# Patient Record
Sex: Female | Born: 2000 | Hispanic: Yes | Marital: Single | Smoking: Never smoker
Health system: Southern US, Community
[De-identification: ages and names within clinical notes are randomized; demographics above are authoritative.]

## PROBLEM LIST (undated history)

## (undated) DIAGNOSIS — R87629 Unspecified abnormal cytological findings in specimens from vagina: Secondary | ICD-10-CM

## (undated) DIAGNOSIS — J189 Pneumonia, unspecified organism: Secondary | ICD-10-CM

## (undated) HISTORY — PX: CHOLECYSTECTOMY: SHX55

## (undated) HISTORY — DX: Unspecified abnormal cytological findings in specimens from vagina: R87.629

---
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
## 2021-03-31 ENCOUNTER — Emergency Department (HOSPITAL_COMMUNITY): Payer: Medicaid Other

## 2021-03-31 ENCOUNTER — Encounter (HOSPITAL_COMMUNITY): Payer: Self-pay

## 2021-03-31 ENCOUNTER — Emergency Department (HOSPITAL_COMMUNITY)
Admission: EM | Admit: 2021-03-31 | Discharge: 2021-04-01 | Disposition: A | Discharge status: Home / Self Care | Payer: Medicaid Other | Attending: Emergency Medicine | Admitting: Emergency Medicine

## 2021-03-31 ENCOUNTER — Other Ambulatory Visit: Payer: Self-pay

## 2021-03-31 DIAGNOSIS — S301XXA Contusion of abdominal wall, initial encounter: Secondary | ICD-10-CM

## 2021-03-31 DIAGNOSIS — S298XXA Other specified injuries of thorax, initial encounter: Secondary | ICD-10-CM

## 2021-03-31 DIAGNOSIS — Z79899 Other long term (current) drug therapy: Secondary | ICD-10-CM | POA: Insufficient documentation

## 2021-03-31 DIAGNOSIS — J45909 Unspecified asthma, uncomplicated: Secondary | ICD-10-CM | POA: Diagnosis not present

## 2021-03-31 DIAGNOSIS — S3991XA Unspecified injury of abdomen, initial encounter: Secondary | ICD-10-CM | POA: Diagnosis present

## 2021-03-31 DIAGNOSIS — Y92410 Unspecified street and highway as the place of occurrence of the external cause: Secondary | ICD-10-CM | POA: Diagnosis not present

## 2021-03-31 LAB — I-STAT BETA HCG BLOOD, ED (MC, WL, AP ONLY): I-stat hCG, quantitative: <5 m[IU]/mL (ref <5)
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
## 2021-04-01 NOTE — ED Notes (Signed)
Discharge instructions reviewed with patient . Patient verbalized understanding of instructions. Follow-up care was reviewed. Patient ambulatory with steady gait. VSS upon discharge.  

## 2021-10-13 ENCOUNTER — Encounter (HOSPITAL_BASED_OUTPATIENT_CLINIC_OR_DEPARTMENT_OTHER): Payer: Self-pay

## 2021-10-13 DIAGNOSIS — J351 Hypertrophy of tonsils: Secondary | ICD-10-CM | POA: Diagnosis not present

## 2021-10-13 DIAGNOSIS — Z20822 Contact with and (suspected) exposure to covid-19: Secondary | ICD-10-CM | POA: Insufficient documentation

## 2021-10-13 DIAGNOSIS — J029 Acute pharyngitis, unspecified: Secondary | ICD-10-CM | POA: Diagnosis present

## 2021-10-13 NOTE — ED Triage Notes (Signed)
Sore throat x 1 week  Worse today

## 2021-10-14 ENCOUNTER — Emergency Department (HOSPITAL_BASED_OUTPATIENT_CLINIC_OR_DEPARTMENT_OTHER)
Admission: EM | Admit: 2021-10-14 | Discharge: 2021-10-14 | Disposition: A | Discharge status: Home / Self Care | Payer: Medicaid Other | Attending: Emergency Medicine | Admitting: Emergency Medicine

## 2021-10-14 DIAGNOSIS — J029 Acute pharyngitis, unspecified: Secondary | ICD-10-CM

## 2021-10-14 DIAGNOSIS — J351 Hypertrophy of tonsils: Secondary | ICD-10-CM

## 2021-10-14 LAB — GROUP A STREP BY PCR: Group A Strep by PCR: NOT DETECTED

## 2021-10-14 LAB — SARS CORONAVIRUS 2 BY RT PCR: SARS Coronavirus 2 by RT PCR: NEGATIVE

## 2021-10-14 MED ORDER — LIDOCAINE VISCOUS HCL 2 % MT SOLN
15.0000 mL | Freq: Once | OROMUCOSAL | Status: AC
  Administered 2021-10-14: 15 mL via OROMUCOSAL
  Filled 2021-10-14: qty 15

## 2021-10-14 MED ORDER — DEXAMETHASONE SODIUM PHOSPHATE 10 MG/ML IJ SOLN
10.0000 mg | Freq: Once | INTRAMUSCULAR | Status: AC
  Administered 2021-10-14: 10 mg via INTRAMUSCULAR
  Filled 2021-10-14: qty 1

## 2021-10-14 NOTE — ED Provider Notes (Signed)
[facility] EMERGENCY DEPT Provider Note   CSN: [id number] Arrival date & time: 10/13/21  2331     History  Chief Complaint  Patient presents with   Sore Throat    Kaylee Alvarez is a 21 y.o. female.  The history is provided by the patient.  Sore Throat This is a new problem. The current episode started more than 1 week ago. The problem occurs constantly. The problem has not changed since onset.Pertinent negatives include no chest pain, no abdominal pain, no headaches and no shortness of breath. Nothing aggravates the symptoms. Nothing relieves the symptoms. She has tried nothing for the symptoms. The treatment provided no relief.       Home Medications Prior to Admission medications   Medication Sig Start Date End Date Taking? Authorizing Provider  cetirizine (ZYRTEC) 10 MG tablet Take 10 mg by mouth at bedtime as needed for allergies. 06/16/20   [provider]  MILI 0.25-35 MG-MCG tablet Take 1 tablet by mouth daily. 06/16/20   [provider]  ondansetron (ZOFRAN ODT) 4 MG disintegrating tablet Take 1 tablet (4 mg total) by mouth every 8 (eight) hours as needed for nausea or vomiting. 07/01/20   Petrucelli, Samantha R, PA-C      Allergies    Ibuprofen    Review of Systems   Review of Systems  Constitutional:  Negative for fever.  HENT:  Positive for sore throat. Negative for facial swelling, trouble swallowing and voice change.   Eyes:  Negative for redness.  Respiratory:  Negative for shortness of breath.   Cardiovascular:  Negative for chest pain.  Gastrointestinal:  Negative for abdominal pain.  Neurological:  Negative for headaches.  All other systems reviewed and are negative.   Physical Exam Updated Vital Signs BP 108/71   Pulse 86   Temp 98.3 F (36.8 C) (Oral)   Resp 18   Ht 5\' 2"  (1.575 m)   Wt 74.8 kg   LMP 09/09/2021 (Exact Date)   SpO2 100%   BMI 30.18 kg/m  Physical Exam Vitals and nursing note  reviewed.  Constitutional:      General: She is not in acute distress.    Appearance: She is well-developed.  HENT:     Head: Normocephalic and atraumatic.     Nose: Nose normal.     Mouth/Throat:     Mouth: Mucous membranes are moist.     Pharynx: No oropharyngeal exudate or posterior oropharyngeal erythema.     Comments: Large symmetric tonsils, intact phonation  Eyes:     Pupils: Pupils are equal, round, and reactive to light.  Cardiovascular:     Rate and Rhythm: Normal rate and regular rhythm.     Pulses: Normal pulses.     Heart sounds: Normal heart sounds.  Pulmonary:     Effort: Pulmonary effort is normal. No respiratory distress.     Breath sounds: Normal breath sounds.  Abdominal:     General: Abdomen is flat. Bowel sounds are normal. There is no distension.     Palpations: Abdomen is soft.     Tenderness: There is no abdominal tenderness. There is no guarding or rebound.  Genitourinary:    Vagina: No vaginal discharge.  Musculoskeletal:        General: Normal range of motion.     Cervical back: Normal range of motion and neck supple.  Lymphadenopathy:     Cervical: No cervical adenopathy.  Skin:    General: Skin is warm and dry.  Capillary Refill: Capillary refill takes less than 2 seconds.     Findings: No erythema or rash.  Neurological:     General: No focal deficit present.     Mental Status: She is oriented to person, place, and time.     Deep Tendon Reflexes: Reflexes normal.  Psychiatric:        Mood and Affect: Mood normal.        Behavior: Behavior normal.     ED Results / Procedures / Treatments   Labs (all labs ordered are listed, but only abnormal results are displayed) Labs Reviewed  GROUP A STREP BY PCR  SARS CORONAVIRUS 2 BY RT PCR    EKG None  Radiology No results found.  Procedures Procedures    Medications Ordered in ED Medications  lidocaine (XYLOCAINE) 2 % viscous mouth solution 15 mL (15 mLs Mouth/Throat Given 10/14/21  0144)  dexamethasone (DECADRON) injection 10 mg (10 mg Intramuscular Given 10/14/21 0155)    ED Course/ Medical Decision Making/ A&P                           Medical Decision Making Patient with a weeks worth of sore throat.    Problems Addressed: Tonsillar enlargement:    Details: follow up with ENT Viral pharyngitis:    Details: tylenol and ice cold liquids  Amount and/or Complexity of Data Reviewed Independent Historian: parent    Details: see above Labs: ordered.    Details: negative covid and strep.  Risk Prescription drug management. Risk Details: Symptoms appear to be viral, patient does have tonsillar hypertrophy. Tylenol and pain relievers patient is not allergic to, cold liquids.  Follow up with ENT for ongoing care.  Strict return precautions givewn     Final Clinical Impression(s) / ED Diagnoses Final diagnoses:  Viral pharyngitis  Tonsillar enlargement   Return for intractable cough, coughing up blood, fevers > 100.4 unrelieved by medication, shortness of breath, intractable vomiting, chest pain, shortness of breath, weakness, numbness, changes in speech, facial asymmetry, abdominal pain, passing out, Inability to tolerate liquids or food, cough, altered mental status or any concerns. No signs of systemic illness or infection. The patient is nontoxic-appearing on exam and vital signs are within normal limits.  I have reviewed the triage vital signs and the nursing notes. Pertinent labs & imaging results that were available during my care of the patient were reviewed by me and considered in my medical decision making (see chart for details). After history, exam, and medical workup I feel the patient has been appropriately medically screened and is safe for discharge home. Pertinent diagnoses were discussed with the patient. Patient was given return precautions.        Rx / DC Orders ED Discharge Orders     None         Connelly [place], MD 10/14/21 6314

## 2021-10-14 NOTE — ED Notes (Signed)
Pt verbalizes understanding of discharge instructions. Opportunity for questioning and answers were provided. Pt discharged from ED to home with family.    

## 2021-10-17 ENCOUNTER — Encounter (HOSPITAL_BASED_OUTPATIENT_CLINIC_OR_DEPARTMENT_OTHER): Payer: Self-pay

## 2021-10-17 ENCOUNTER — Emergency Department (HOSPITAL_BASED_OUTPATIENT_CLINIC_OR_DEPARTMENT_OTHER): Payer: Medicaid Other

## 2021-10-17 ENCOUNTER — Emergency Department (HOSPITAL_BASED_OUTPATIENT_CLINIC_OR_DEPARTMENT_OTHER)
Admission: EM | Admit: 2021-10-17 | Discharge: 2021-10-18 | Disposition: A | Discharge status: Home / Self Care | Payer: Medicaid Other | Attending: Emergency Medicine | Admitting: Emergency Medicine

## 2021-10-17 ENCOUNTER — Other Ambulatory Visit: Payer: Self-pay

## 2021-10-17 DIAGNOSIS — J029 Acute pharyngitis, unspecified: Secondary | ICD-10-CM | POA: Diagnosis present

## 2021-10-17 DIAGNOSIS — D72829 Elevated white blood cell count, unspecified: Secondary | ICD-10-CM | POA: Insufficient documentation

## 2021-10-17 DIAGNOSIS — J36 Peritonsillar abscess: Secondary | ICD-10-CM | POA: Diagnosis not present

## 2021-10-17 DIAGNOSIS — R000 Tachycardia, unspecified: Secondary | ICD-10-CM | POA: Insufficient documentation

## 2021-10-17 LAB — CBC WITH DIFFERENTIAL/PLATELET
Abs Immature Granulocytes: 0.07 10*3/uL (ref 0.00–0.07)
Basophils Absolute: 0.1 10*3/uL (ref 0.0–0.1)
Basophils Relative: 0 %
Eosinophils Absolute: 0.1 10*3/uL (ref 0.0–0.5)
Eosinophils Relative: 0 %
HCT: 39 % (ref 36.0–46.0)
Hemoglobin: 12.7 g/dL (ref 12.0–15.0)
Immature Granulocytes: 0 %
Lymphocytes Relative: 19 %
Lymphs Abs: 3.1 10*3/uL (ref 0.7–4.0)
MCH: 28 pg (ref 26.0–34.0)
MCHC: 32.6 g/dL (ref 30.0–36.0)
MCV: 85.9 fL (ref 80.0–100.0)
Monocytes Absolute: 1.1 10*3/uL — ABNORMAL HIGH (ref 0.1–1.0)
Monocytes Relative: 7 %
Neutro Abs: 12 10*3/uL — ABNORMAL HIGH (ref 1.7–7.7)
Neutrophils Relative %: 74 %
Platelets: 264 10*3/uL (ref 150–400)
RBC: 4.54 MIL/uL (ref 3.87–5.11)
RDW: 13 % (ref 11.5–15.5)
WBC: 16.4 10*3/uL — ABNORMAL HIGH (ref 4.0–10.5)
nRBC: 0 % (ref 0.0–0.2)

## 2021-10-17 LAB — COMPREHENSIVE METABOLIC PANEL
ALT: 21 U/L (ref 0–44)
AST: 17 U/L (ref 15–41)
Albumin: 4.2 g/dL (ref 3.5–5.0)
Alkaline Phosphatase: 82 U/L (ref 38–126)
Anion gap: 10 (ref 5–15)
BUN: 7 mg/dL (ref 6–20)
CO2: 25 mmol/L (ref 22–32)
Calcium: 9.5 mg/dL (ref 8.9–10.3)
Chloride: 100 mmol/L (ref 98–111)
Creatinine, Ser: 0.65 mg/dL (ref 0.44–1.00)
GFR, Estimated: >60 mL/min (ref >60)
Glucose, Bld: 98 mg/dL (ref 70–99)
Potassium: 3.8 mmol/L (ref 3.5–5.1)
Sodium: 135 mmol/L (ref 135–145)
Total Bilirubin: 0.6 mg/dL (ref 0.3–1.2)
Total Protein: 7.7 g/dL (ref 6.5–8.1)

## 2021-10-17 LAB — URINALYSIS, ROUTINE W REFLEX MICROSCOPIC
Bilirubin Urine: NEGATIVE
Glucose, UA: NEGATIVE mg/dL
Ketones, ur: 40 mg/dL — AB
Nitrite: NEGATIVE
Protein, ur: NEGATIVE mg/dL
Specific Gravity, Urine: 1.017 (ref 1.005–1.030)
pH: 6 (ref 5.0–8.0)

## 2021-10-17 LAB — HCG, QUANTITATIVE, PREGNANCY: hCG, Beta Chain, Quant, S: <1 m[IU]/mL (ref <5)

## 2021-10-17 LAB — GROUP A STREP BY PCR: Group A Strep by PCR: NOT DETECTED

## 2021-10-17 MED ORDER — ACETAMINOPHEN 325 MG PO TABS
650.0000 mg | ORAL_TABLET | Freq: Once | ORAL | Status: AC
  Administered 2021-10-17: 650 mg via ORAL
  Filled 2021-10-17: qty 2

## 2021-10-17 MED ORDER — LACTATED RINGERS IV BOLUS
1000.0000 mL | Freq: Once | INTRAVENOUS | Status: AC
  Administered 2021-10-17: 1000 mL via INTRAVENOUS

## 2021-10-17 MED ORDER — DEXAMETHASONE SODIUM PHOSPHATE 10 MG/ML IJ SOLN
20.0000 mg | Freq: Once | INTRAMUSCULAR | Status: AC
  Administered 2021-10-17: 20 mg via INTRAVENOUS
  Filled 2021-10-17 (×2): qty 2

## 2021-10-17 MED ORDER — SODIUM CHLORIDE 0.9 % IV SOLN
3.0000 g | Freq: Four times a day (QID) | INTRAVENOUS | Status: DC
  Administered 2021-10-17: 3 g via INTRAVENOUS

## 2021-10-17 MED ORDER — MORPHINE SULFATE (PF) 4 MG/ML IV SOLN
4.0000 mg | Freq: Once | INTRAVENOUS | Status: AC
  Administered 2021-10-17: 4 mg via INTRAVENOUS
  Filled 2021-10-17: qty 1

## 2021-10-17 MED ORDER — IOHEXOL 300 MG/ML  SOLN
100.0000 mL | Freq: Once | INTRAMUSCULAR | Status: AC | PRN
  Administered 2021-10-17: 75 mL via INTRAVENOUS

## 2021-10-17 MED ORDER — CLINDAMYCIN HCL 300 MG PO CAPS: 300.0000 mg | ORAL_CAPSULE | Freq: Four times a day (QID) | ORAL | 0 refills | Status: AC

## 2021-10-17 NOTE — ED Provider Notes (Signed)
[facility] EMERGENCY DEPT Provider Note   CSN: [id number] Arrival date & time: 10/17/21  1928     History Chief Complaint  Patient presents with   Facial Swelling    Kaylee Alvarez is a 21 y.o. female otherwise healthy presents to the emergency room for evaluation of 6 days of a sore throat.  Patient reports that her sore throat started on Monday.  She came into the emergency department on Wednesday for evaluation and was given a COVID and strep test that was negative.  They advised her to take some ibuprofen.  She reports that she continued to have worsening pain she felt like her face and neck were swelling.  She did not record any temperatures at home.  She reports that she has pain with swallowing.  Decreased p.o. intake.  She denies any chest pain, shortness of breath, chills, abdominal pain, nausea, vomiting, cough.  She reports that she has ibuprofen listed as an allergy as a child although has had ibuprofen in her adulthood without any issues.  She has had her gallbladder removed.  No other drug allergies.  Denies any tobacco, EtOH illicit drug use ever.  HPI     Home Medications Prior to Admission medications   Medication Sig Start Date End Date Taking? Authorizing Provider  cetirizine (ZYRTEC) 10 MG tablet Take 10 mg by mouth at bedtime as needed for allergies. 06/16/20   [provider]  MILI 0.25-35 MG-MCG tablet Take 1 tablet by mouth daily. 06/16/20   [provider]  ondansetron (ZOFRAN ODT) 4 MG disintegrating tablet Take 1 tablet (4 mg total) by mouth every 8 (eight) hours as needed for nausea or vomiting. 07/01/20   Petrucelli, Samantha R, PA-C      Allergies    Ibuprofen    Review of Systems   Review of Systems  Constitutional:  Negative for chills and fever.  HENT:  Positive for sore throat and trouble swallowing. Negative for congestion and rhinorrhea.   Respiratory:  Negative for shortness of breath.   Cardiovascular:   Negative for chest pain.  Gastrointestinal:  Negative for abdominal pain, nausea and vomiting.  Genitourinary:  Negative for dysuria and hematuria.  Musculoskeletal:  Positive for neck pain. Negative for myalgias.  Neurological:  Negative for headaches.    Physical Exam Updated Vital Signs BP 132/84   Pulse (!) 124   Temp 100.1 F (37.8 C) (Oral)   Resp 18   Ht 5\' 2"  (1.575 m)   Wt 75.3 kg   LMP 09/09/2021 (Exact Date)   SpO2 99%   BMI 30.36 kg/m  Physical Exam Vitals and nursing note reviewed.  Constitutional:      Appearance: Normal appearance.  HENT:     Head: Normocephalic and atraumatic.     Right Ear: Tympanic membrane, ear canal and external ear normal.     Left Ear: Tympanic membrane, ear canal and external ear normal.     Nose: Nose normal.     Mouth/Throat:     Mouth: Mucous membranes are moist.     Tonsils: Tonsillar exudate and tonsillar abscess present. 2+ on the right. 4+ on the left.     Comments: Tonsillar abscess seen on the left with deviation of the uvula. Exudate present. Patient is controlling secretions although mild trismus is present.  Eyes:     General: No scleral icterus. Cardiovascular:     Rate and Rhythm: Regular rhythm. Tachycardia present.  Pulmonary:     Effort: Pulmonary effort  is normal.     Breath sounds: Normal breath sounds.  Abdominal:     General: Bowel sounds are normal.     Palpations: Abdomen is soft.     Tenderness: There is no abdominal tenderness. There is no guarding or rebound.  Musculoskeletal:        General: No deformity.     Cervical back: Normal range of motion.  Lymphadenopathy:     Cervical: Cervical adenopathy present.  Skin:    General: Skin is warm and dry.  Neurological:     General: No focal deficit present.     Mental Status: She is alert. Mental status is at baseline.     ED Results / Procedures / Treatments   Labs (all labs ordered are listed, but only abnormal results are displayed) Labs  Reviewed  CBC WITH DIFFERENTIAL/PLATELET - Abnormal; Notable for the following components:      Result Value   WBC 16.4 (*)    Neutro Abs 12.0 (*)    Monocytes Absolute 1.1 (*)    All other components within normal limits  URINALYSIS, ROUTINE W REFLEX MICROSCOPIC - Abnormal; Notable for the following components:   Hgb urine dipstick TRACE (*)    Ketones, ur 40 (*)    Leukocytes,Ua MODERATE (*)    All other components within normal limits  GROUP A STREP BY PCR  COMPREHENSIVE METABOLIC PANEL  HCG, QUANTITATIVE, PREGNANCY    EKG None  Radiology CT Soft Tissue Neck W Contrast  Result Date: 10/17/2021 CLINICAL DATA:  Sore throat EXAM: CT NECK WITH CONTRAST TECHNIQUE: Multidetector CT imaging of the neck was performed using the standard protocol following the bolus administration of intravenous contrast. RADIATION DOSE REDUCTION: This exam was performed according to the departmental dose-optimization program which includes automated exposure control, adjustment of the mA and/or kV according to patient size and/or use of iterative reconstruction technique. CONTRAST:  5mL OMNIPAQUE IOHEXOL 300 MG/ML  SOLN COMPARISON:  None Available. FINDINGS: PHARYNX AND LARYNX: The palatine tonsils are enlarged. There is a 2.1 cm left palatine peritonsillar abscess. No retropharyngeal abnormality. The epiglottis and larynx are normal. SALIVARY GLANDS: Normal parotid, submandibular and sublingual glands. THYROID: Normal. LYMPH NODES: Reactive cervical lymphadenopathy, left-greater-than-right. VASCULAR: Major cervical vessels are patent. LIMITED INTRACRANIAL: Normal. VISUALIZED ORBITS: Normal. MASTOIDS AND VISUALIZED PARANASAL SINUSES: No fluid levels or advanced mucosal thickening. No mastoid effusion. SKELETON: No bony spinal canal stenosis. No lytic or blastic lesions. UPPER CHEST: Clear. OTHER: None. IMPRESSION: 1. Acute tonsillopharyngitis with 2.1 cm left palatine peritonsillar abscess. 2. Reactive cervical  lymphadenopathy. Electronically Signed   By: Deatra Robinson M.D.   On: 10/17/2021 21:00    Procedures Procedures    Medications Ordered in ED Medications  Ampicillin-Sulbactam (UNASYN) 3 g in sodium chloride 0.9 % 100 mL IVPB (0 g Intravenous Stopped 10/17/21 2133)  morphine (PF) 4 MG/ML injection 4 mg (4 mg Intravenous Given 10/17/21 2035)  lactated ringers bolus 1,000 mL (1,000 mLs Intravenous New Bag/Given 10/17/21 2035)  iohexol (OMNIPAQUE) 300 MG/ML solution 100 mL (75 mLs Intravenous Contrast Given 10/17/21 2041)  dexamethasone (DECADRON) injection 20 mg (20 mg Intravenous Given 10/17/21 2132)    ED Course/ Medical Decision Making/ A&P                           Medical Decision Making Amount and/or Complexity of Data Reviewed Labs: ordered. Radiology: ordered.  Risk Prescription drug management.   21 year old female presents the emergency room  for evaluation of sore throat for the past 6 days.  Differential diagnosis includes was not limited to viral illness, COVID, flu, strep, PTA, allergies.  Vital signs show elevated temperature at 100.1, patient's tachycardic in the 120s.  Normotensive, satting well room air with any increased work of breathing.  Physical exam is pertinent for tonsillar abscess seen on the left with deviation of the uvula. Exudate present. Patient is controlling secretions although mild trismus is present. Cervical LAD present.  Patient is controlling her secretions and is speaking in full sentences.  Labs ordered.  Given the patient's physical exam, concern for PTA, will order CT soft tissue neck with contrast.  I independently reviewed and interpreted the patient's labs.  Urinalysis shows clear urine with trace amount of hemoglobin and 40 ketones.  There is moderate amount of leukocytes but 6-10 white blood cells seen.  There is mucus present as well as squames, possible dirty catch as the patient is asymptomatic.  CMP shows no electrolyte or LFT abnormality.   hCG negative.  Strep negative.  CBC shows leukocytosis at 16.4 with a left shift.  No anemia.  CT of her neck shows acute tonsillopharyngitis with 2.1 cm left palatine peritonsillar abscess. Reactive cervical lymphadenopathy.  Because of the short supply of IV clindamycin, we gave her 3 g of Unasyn as well as some morphine, 1 L of LR.  Consult was placed to ENT and was returned by Dr. Benjamine Mola.  He recommended discharging the patient on clindamycin 300 mg 4 times daily for the next 10 days as well as following up in the office.  He additionally recommended 20 mg of Decadron IV.  He does not see the patient needs to be admitted.  On reevaluation, patient reports that she is feeling slightly better.  She was given the 20 mg of Decadron IV.  I discussed with her that we will need to continue giving her IV fluids to see if her heart rate goes down any and to reevaluate her.  I discussed with her that pending the soft my patient Dr. Laverta Baltimore.  I discussed this case with my attending physician who cosigned this note including patient's presenting symptoms, physical exam, and planned diagnostics and interventions. Attending physician stated agreement with plan or made changes to plan which were implemented.   Attending physician assessed patient at bedside.  10:15 PM Care of Kaylee Alvarez transferred to Dr. Laverta Baltimore at the end of my shift as the patient will require reassessment once labs/imaging have resulted. Patient presentation, ED course, and plan of care discussed with review of all pertinent labs and imaging. Please see his/her note for further details regarding further ED course and disposition. Plan at time of handoff is follow up and re-assess after IV fluids.  Follow-up on lactic acid.  The patient will need to follow up outpatient with Dr. Benjamine Mola. This may be altered or completely changed at the discretion of the oncoming team pending results of further workup.  Final Clinical Impression(s) / ED  Diagnoses Final diagnoses:  Peritonsillar abscess    Rx / DC Orders ED Discharge Orders          Ordered    clindamycin (CLEOCIN) 300 MG capsule  4 times daily        10/17/21 2223              Sherrell Puller, PA-C 10/17/21 2229    Long, W[facility], MD 10/21/21 1018

## 2022-06-12 IMAGING — US US ABDOMEN LIMITED RUQ/ASCITES
1 series · 15 of 25 positions shown · non-contrast
Comparison: None.
COMPARISON: None.
COMPARISON: None.

Addendum:
CLINICAL DATA: Abdominal pain

EXAM:
ULTRASOUND ABDOMEN LIMITED RIGHT UPPER QUADRANT

[Series 1: us abdomen limited ruq (liver/gb) · 15 of 90 slices shown]
[im 1/90]
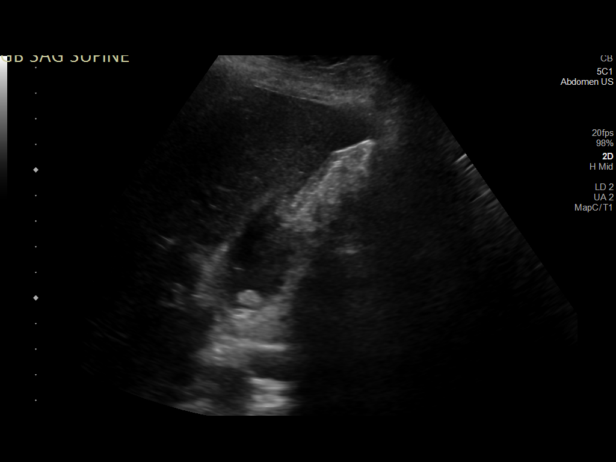
[im 8/90]
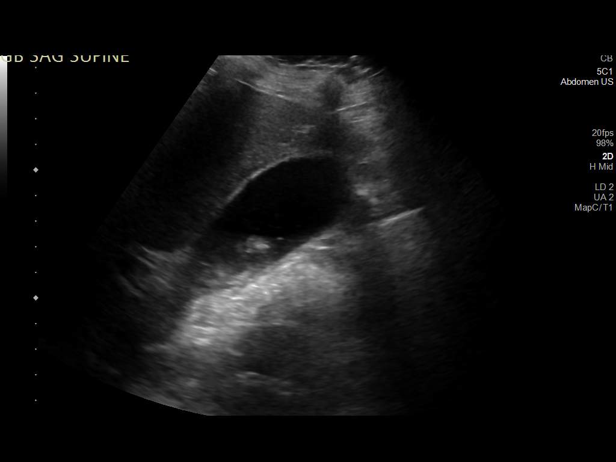
[im 15/90]
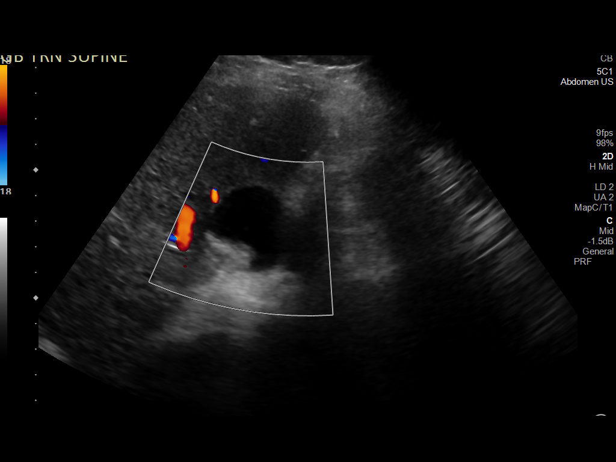
[im 19/90]
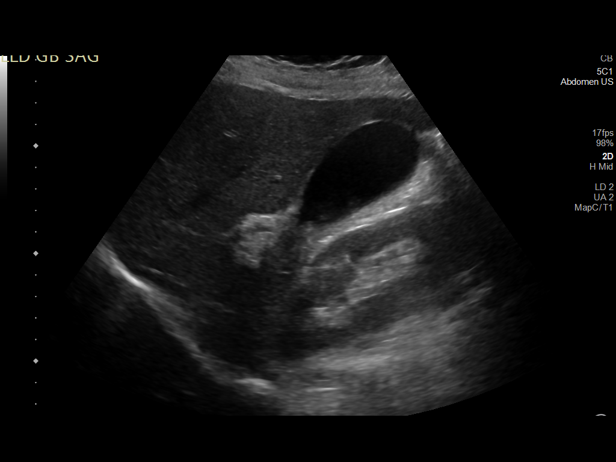
[im 26/90]
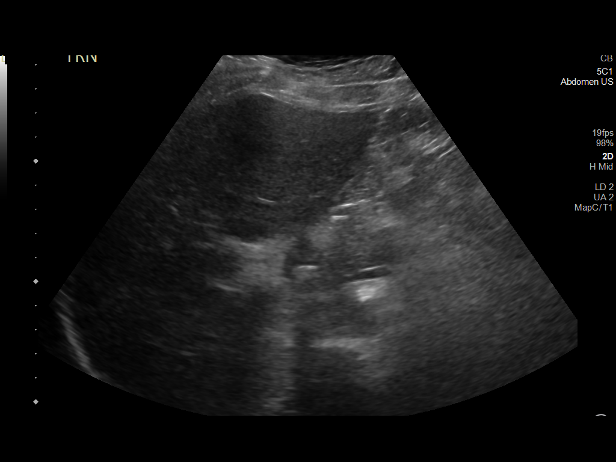
[im 34/90]
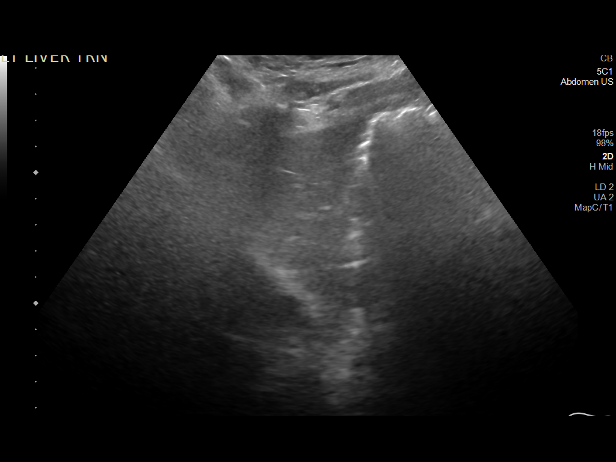
[im 38/90]
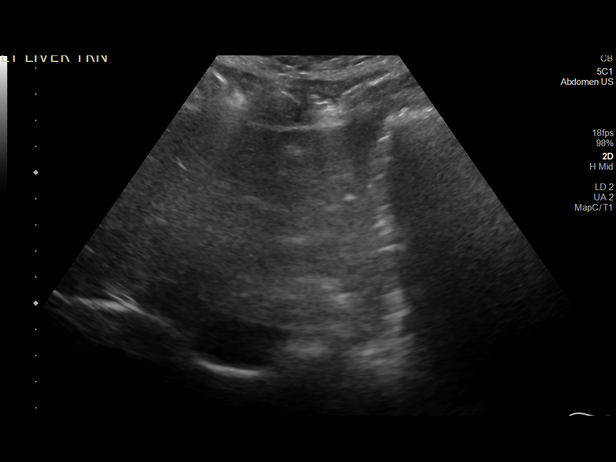
[im 45/90]
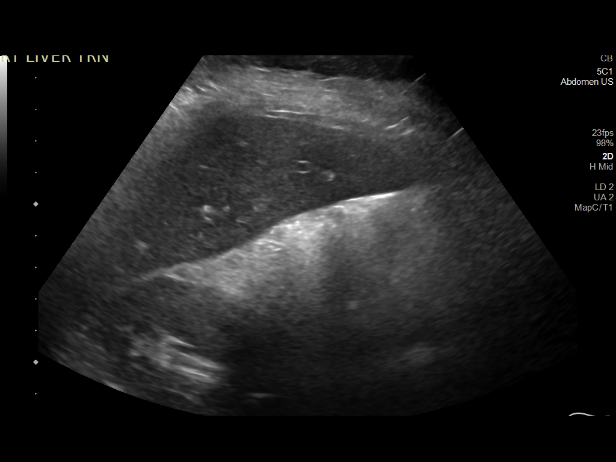
[im 52/90]
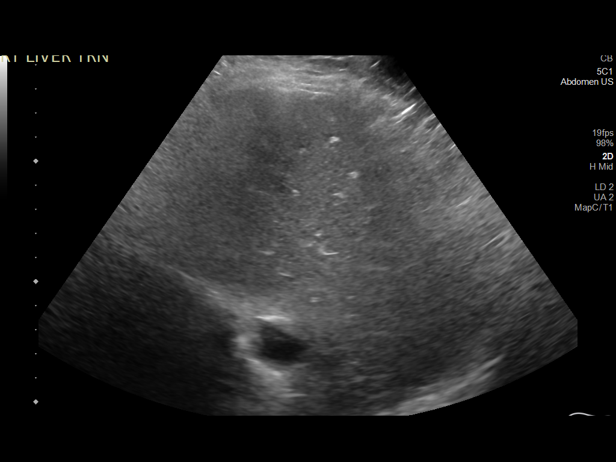
[im 56/90]
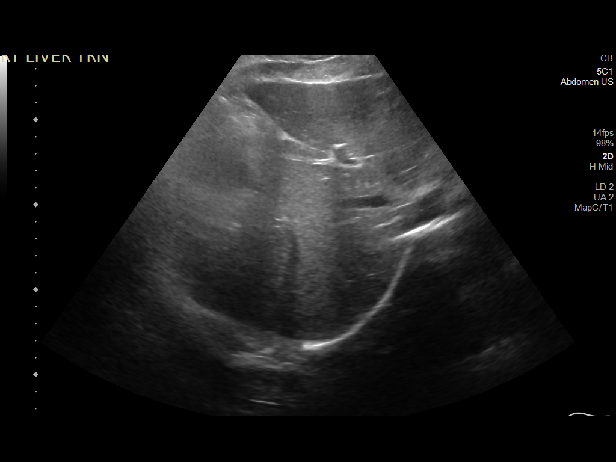
[im 64/90]
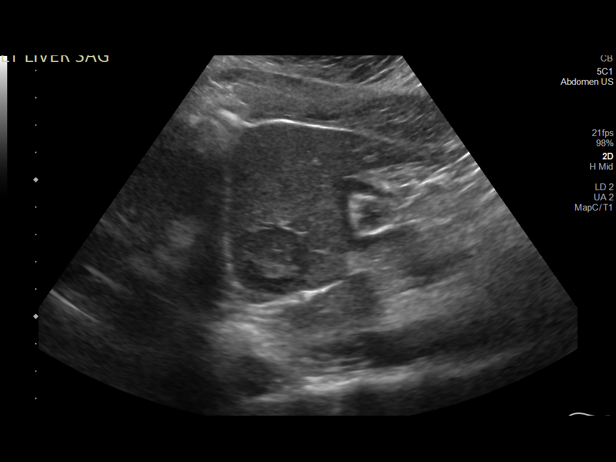
[im 71/90]
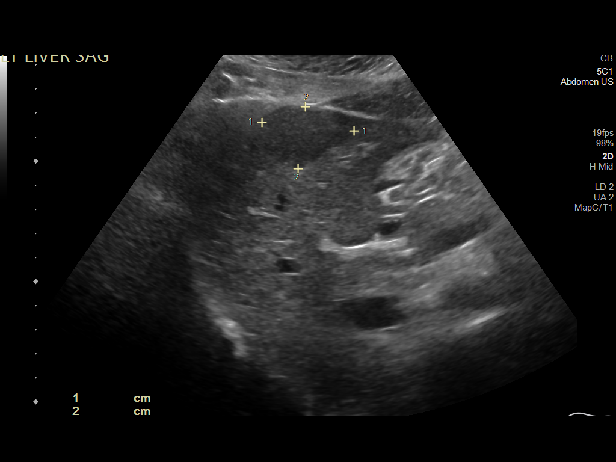
[im 75/90]
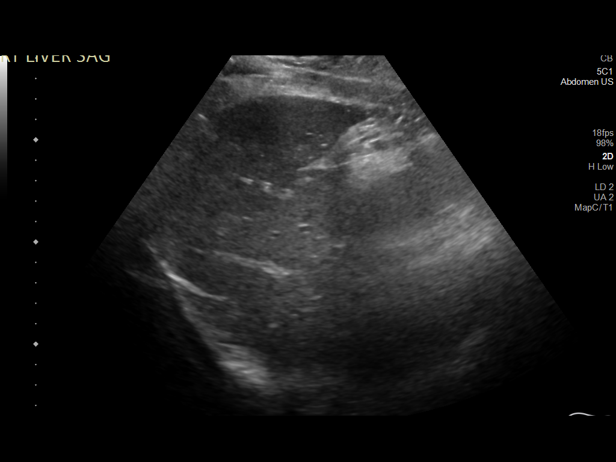
[im 82/90]
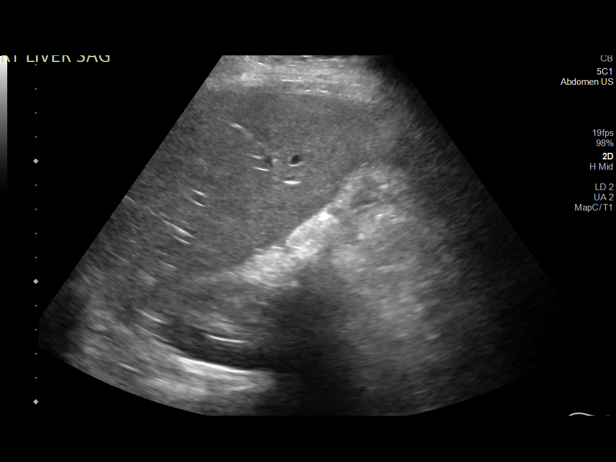
[im 90/90]
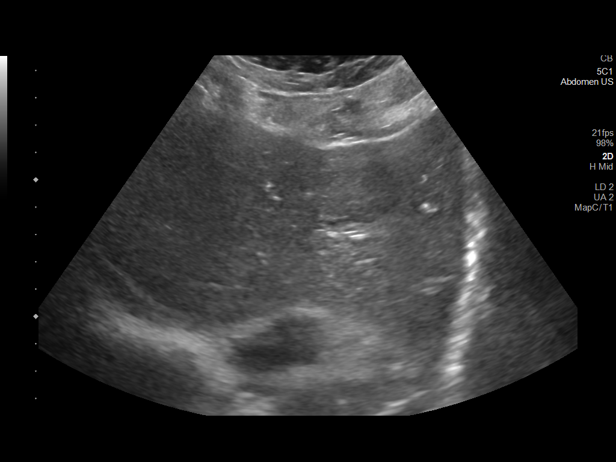

[15 of 25 positions shown; findings below may reference images not displayed]

FINDINGS: Gallbladder:

Gallbladder contains multiple echogenic shadowing gallstones and
admixed biliary sludge. Largest gallstone measures up to 1.3 cm in
diameter. Gallbladder wall thickness within normal limits. No
pericholecystic fluid. Sonographic Murphy sign is reportedly
negative though can be unreliable given recent administration of
fentanyl.

Common bile duct:

Diameter: 4.2 mm, nondilated

Liver:

Heterogeneous lesion measuring 2.9 x 2.5 x 4.5 cm seen adjacent to
the caudate lobe with a possible feeding vessel, incompletely
characterized additional ill-defined regions of heterogeneity in the
anterior left lobe liver with some surface contour irregularity,
nonspecific. Region measures approximately 3.8 x 2.6 x 2.5 cm.
Within normal limits in parenchymal echogenicity. Portal vein is
patent on color Doppler imaging with normal direction of blood flow
towards the liver.

Other: None.
IMPRESSION: 1. Cholelithiasis and biliary sludge without sonographic evidence of
acute cholecystitis. Please note the absence of a sonographic Murphy
sign may be due to patient premedication.
2. Heterogeneous lesion adjacent to the caudate lobe measuring up to
4.5 cm with a possible feeding vessel. Additional contour deforming
subcapsular region of heterogeneity. Appearance is indeterminate on
sonography, recommend further evaluation with liver protocol MR
imaging or multiphase liver CT if patient is unable to obtain MR.

ADDENDUM:
These results were called by telephone at the time of interpretation
on 07/01/2020 at [DATE] to provider RUBICEL RESNICK , who
verbally acknowledged these results.

:
Given patient's age and oral contraceptive use, primary differential
consideration would include a focal nodular hyperplasia in the
absence of other risk factors. Regardless, incompletely
characterized on this exam and should consider outpatient follow-up
imaging as warranted clinically.

Further case discussion by telephone at the time of this addendum
submission on 07/01/2020 at [DATE] to provider RUBICEL RESNICK ,
who verbally acknowledged these results.

*** End of Addendum ***
Addendum:
FINDINGS: Gallbladder:

Gallbladder contains multiple echogenic shadowing gallstones and
admixed biliary sludge. Largest gallstone measures up to 1.3 cm in
diameter. Gallbladder wall thickness within normal limits. No
pericholecystic fluid. Sonographic Murphy sign is reportedly
negative though can be unreliable given recent administration of
fentanyl.

Common bile duct:

Diameter: 4.2 mm, nondilated

Liver:

Heterogeneous lesion measuring 2.9 x 2.5 x 4.5 cm seen adjacent to
the caudate lobe with a possible feeding vessel, incompletely
characterized additional ill-defined regions of heterogeneity in the
anterior left lobe liver with some surface contour irregularity,
nonspecific. Region measures approximately 3.8 x 2.6 x 2.5 cm.
Within normal limits in parenchymal echogenicity. Portal vein is
patent on color Doppler imaging with normal direction of blood flow
towards the liver.

Other: None.
IMPRESSION: 1. Cholelithiasis and biliary sludge without sonographic evidence of
acute cholecystitis. Please note the absence of a sonographic Murphy
sign may be due to patient premedication.
2. Heterogeneous lesion adjacent to the caudate lobe measuring up to
4.5 cm with a possible feeding vessel. Additional contour deforming
subcapsular region of heterogeneity. Appearance is indeterminate on
sonography, recommend further evaluation with liver protocol MR
imaging or multiphase liver CT if patient is unable to obtain MR.

ADDENDUM:
These results were called by telephone at the time of interpretation
on 07/01/2020 at [DATE] to provider RUBICEL RESNICK , who
verbally acknowledged these results.

*** End of Addendum ***
FINDINGS: Gallbladder:

Gallbladder contains multiple echogenic shadowing gallstones and
admixed biliary sludge. Largest gallstone measures up to 1.3 cm in
diameter. Gallbladder wall thickness within normal limits. No
pericholecystic fluid. Sonographic Murphy sign is reportedly
negative though can be unreliable given recent administration of
fentanyl.

Common bile duct:

Diameter: 4.2 mm, nondilated

Liver:

Heterogeneous lesion measuring 2.9 x 2.5 x 4.5 cm seen adjacent to
the caudate lobe with a possible feeding vessel, incompletely
characterized additional ill-defined regions of heterogeneity in the
anterior left lobe liver with some surface contour irregularity,
nonspecific. Region measures approximately 3.8 x 2.6 x 2.5 cm.
Within normal limits in parenchymal echogenicity. Portal vein is
patent on color Doppler imaging with normal direction of blood flow
towards the liver.

Other: None.
IMPRESSION: 1. Cholelithiasis and biliary sludge without sonographic evidence of
acute cholecystitis. Please note the absence of a sonographic Murphy
sign may be due to patient premedication.
2. Heterogeneous lesion adjacent to the caudate lobe measuring up to
4.5 cm with a possible feeding vessel. Additional contour deforming
subcapsular region of heterogeneity. Appearance is indeterminate on
sonography, recommend further evaluation with liver protocol MR
imaging or multiphase liver CT if patient is unable to obtain MR.

## 2022-09-27 ENCOUNTER — Other Ambulatory Visit (HOSPITAL_BASED_OUTPATIENT_CLINIC_OR_DEPARTMENT_OTHER): Payer: Self-pay

## 2022-09-27 ENCOUNTER — Encounter (HOSPITAL_BASED_OUTPATIENT_CLINIC_OR_DEPARTMENT_OTHER): Payer: Self-pay | Admitting: Emergency Medicine

## 2022-09-27 ENCOUNTER — Emergency Department (HOSPITAL_BASED_OUTPATIENT_CLINIC_OR_DEPARTMENT_OTHER)
Admission: EM | Admit: 2022-09-27 | Discharge: 2022-09-27 | Disposition: A | Discharge status: Home / Self Care | Payer: Medicaid Other | Attending: Emergency Medicine | Admitting: Emergency Medicine

## 2022-09-27 ENCOUNTER — Other Ambulatory Visit: Payer: Self-pay

## 2022-09-27 DIAGNOSIS — J029 Acute pharyngitis, unspecified: Secondary | ICD-10-CM | POA: Insufficient documentation

## 2022-09-27 DIAGNOSIS — Z20822 Contact with and (suspected) exposure to covid-19: Secondary | ICD-10-CM | POA: Diagnosis not present

## 2022-09-27 DIAGNOSIS — R059 Cough, unspecified: Secondary | ICD-10-CM | POA: Diagnosis present

## 2022-09-27 LAB — RESP PANEL BY RT-PCR (RSV, FLU A&B, COVID)  RVPGX2
Influenza A by PCR: NEGATIVE
Influenza B by PCR: NEGATIVE
Resp Syncytial Virus by PCR: NEGATIVE
SARS Coronavirus 2 by RT PCR: NEGATIVE

## 2022-09-27 LAB — GROUP A STREP BY PCR: Group A Strep by PCR: NOT DETECTED

## 2022-09-27 MED ORDER — DEXAMETHASONE 4 MG PO TABS
10.0000 mg | ORAL_TABLET | Freq: Once | ORAL | Status: AC
  Administered 2022-09-27: 10 mg via ORAL
  Filled 2022-09-27: qty 3

## 2022-09-27 MED ORDER — LIDOCAINE VISCOUS HCL 2 % MT SOLN
15.0000 mL | Freq: Once | OROMUCOSAL | Status: AC
  Administered 2022-09-27: 15 mL via OROMUCOSAL
  Filled 2022-09-27: qty 15

## 2022-09-27 MED ORDER — ACETAMINOPHEN 500 MG PO TABS
1000.0000 mg | ORAL_TABLET | Freq: Once | ORAL | Status: AC
  Administered 2022-09-27: 1000 mg via ORAL
  Filled 2022-09-27: qty 2

## 2022-09-27 NOTE — Discharge Instructions (Signed)
You were seen for your sore throat in the emergency department.   At home, please take Tylenol and use over-the-counter throat sprays for your sore throat.    Check your MyChart online for the results of any tests that had not resulted by the time you left the emergency department.   Follow-up with your primary doctor in 2-3 days regarding your visit.    Return immediately to the emergency department if you experience any of the following: Difficulty breathing, voice changes, inability to swallow your saliva, or any other concerning symptoms.    Thank you for visiting our Emergency Department. It was a pleasure taking care of you today.

## 2022-09-27 NOTE — ED Triage Notes (Signed)
Pt arrives to ED with c/o cough and sore throat x6 days.

## 2022-09-27 NOTE — ED Provider Notes (Signed)
[place] EMERGENCY DEPARTMENT AT [facility] Provider Note   CSN: [id number] Arrival date & time: 09/27/22  4098     History {Add pertinent medical, surgical, social history, OB history to HPI:1} Chief Complaint  Patient presents with   Sore Throat   Cough    Kaylee Alvarez is a 22 y.o. female.  22 year old female with a history of peritonsillar abscess who presents emergency department with sore throat and cough.  For the past 6 days the patient has been having a sore throat and congestion.  Also started developing a nonproductive cough.  Subjectively felt warm yesterday.  Says that since her symptoms had not been improving she decided to come into the emergency department for evaluation.  No known sick contacts.  No difficulty tolerating her secretions, speaking, or significant shortness of breath.       Home Medications Prior to Admission medications   Medication Sig Start Date End Date Taking? Authorizing Provider  cetirizine (ZYRTEC) 10 MG tablet Take 10 mg by mouth at bedtime as needed for allergies. 06/16/20   [provider]  MILI 0.25-35 MG-MCG tablet Take 1 tablet by mouth daily. 06/16/20   [provider]  ondansetron (ZOFRAN ODT) 4 MG disintegrating tablet Take 1 tablet (4 mg total) by mouth every 8 (eight) hours as needed for nausea or vomiting. 07/01/20   Petrucelli, Pleas Koch, PA-C      Allergies    Amoxicillin and Ibuprofen    Review of Systems   Review of Systems  Physical Exam Updated Vital Signs BP 104/80 (BP Location: Right Arm)   Pulse 80   Temp 98.8 F (37.1 C) (Oral)   Resp 18   Ht 5\' 2"  (1.575 m)   Wt 76.7 kg   SpO2 100%   BMI 30.91 kg/m  Physical Exam Constitutional:      Appearance: She is well-developed.     Comments: Speaking in full sentences tolerating her secretions   HENT:     Head: Normocephalic and atraumatic.     Nose: Congestion present.     Mouth/Throat:     Mouth: Mucous membranes are  moist. No oral lesions.     Pharynx: Oropharyngeal exudate and posterior oropharyngeal erythema present. No uvula swelling.     Tonsils: 2+ on the right. 2+ on the left.  Eyes:     Conjunctiva/sclera: Conjunctivae normal.     Pupils: Pupils are equal, round, and reactive to light.  Cardiovascular:     Heart sounds: Normal heart sounds.  Pulmonary:     Effort: Pulmonary effort is normal.     Breath sounds: Normal breath sounds. No stridor.  Musculoskeletal:     Cervical back: Normal range of motion and neck supple.  Lymphadenopathy:     Cervical: Cervical adenopathy present.  Neurological:     Mental Status: She is alert.     ED Results / Procedures / Treatments   Labs (all labs ordered are listed, but only abnormal results are displayed) Labs Reviewed  RESP PANEL BY RT-PCR (RSV, FLU A&B, COVID)  [id number]  GROUP A STREP BY PCR    EKG None  Radiology No results found.  Procedures Procedures  {Document cardiac monitor, telemetry assessment procedure when appropriate:1}  Medications Ordered in ED Medications  dexamethasone (DECADRON) tablet 10 mg (has no administration in time range)  lidocaine (XYLOCAINE) 2 % viscous mouth solution 15 mL (has no administration in time range)  acetaminophen (TYLENOL) tablet 1,000 mg (has no administration in time  range)    ED Course/ Medical Decision Making/ A&P   {   Click here for ABCD2, HEART and other calculatorsREFRESH Note before signing :1}                          Medical Decision Making Risk OTC drugs. Prescription drug management.   Kaylee Alvarez is a 22 y.o. female with comorbidities that complicate the patient evaluation including peritonsillar abscess who presents with sore throat  Initial Ddx:  Strep throat, PTA, RPA, viral pharyngitis, pneumonia   MDM:  Feel the patient likely has a pharyngitis based on their symptoms. Tolerating secretions and no uvular deviation no be concerning for peritonsillar  abscess.  Her cough is concerning for a URI.  Neck is supple no concerns for RPA at this time.  Centor score is 2 and will send off a rapid strep test at this time.  They are overall well-appearing so do not feel that chest x-ray is warranted.   Plan:  COVID/flu Strep test  ED Summary/Re-evaluation:  ***  This patient presents to the ED for concern of complaints listed in HPI, this involves an extensive number of treatment options, and is a complaint that carries with it a high risk of complications and morbidity. Disposition including potential need for admission considered.   Dispo: DC Home. Return precautions discussed including, but not limited to, those listed in the AVS. Allowed pt time to ask questions which were answered fully prior to dc.  Additional history obtained from {Additional History:28067} Records reviewed {Records Reviewed:28068} I independently reviewed the following imaging with scope of interpretation limited to determining acute life threatening conditions related to emergency care: Chest x-ray and agree with the radiologist interpretation with the following exceptions: None I personally reviewed and interpreted cardiac monitoring: normal sinus rhythm  I have reviewed the patients home medications and made adjustments as needed   {Document critical care time when appropriate:1} {Document review of labs and clinical decision tools ie heart score, Chads2Vasc2 etc:1}  {Document your independent review of radiology images, and any outside records:1} {Document your discussion with family members, caretakers, and with consultants:1} {Document social determinants of health affecting pt's care:1} {Document your decision making why or why not admission, treatments were needed:1} Final Clinical Impression(s) / ED Diagnoses Final diagnoses:  None    Rx / DC Orders ED Discharge Orders     None

## 2022-09-27 NOTE — ED Notes (Signed)
Discharge paperwork given and verbally understood. 

## 2022-11-02 IMAGING — US US ABDOMEN LIMITED
1 series · 13 of 25 positions shown · non-contrast
Comparison: 07/01/2020.

CLINICAL DATA: 19-year-old female with right upper quadrant pain.
Gallstones in [REDACTED].

EXAM:
ULTRASOUND ABDOMEN LIMITED RIGHT UPPER QUADRANT

[Series 1: us abdomen limited · 13 of 39 slices shown]
[im 1/39]
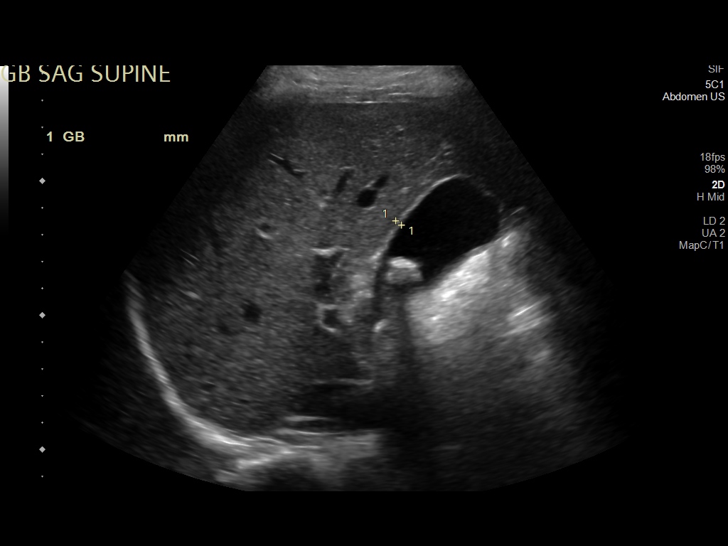
[im 4/39]
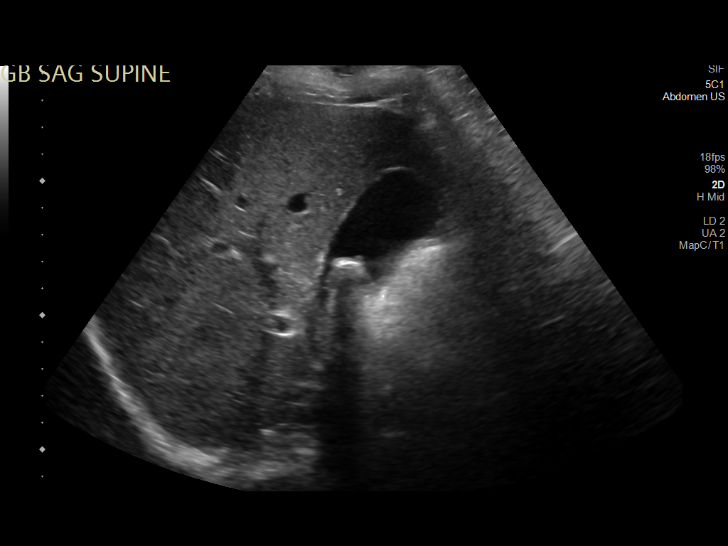
[im 7/39]
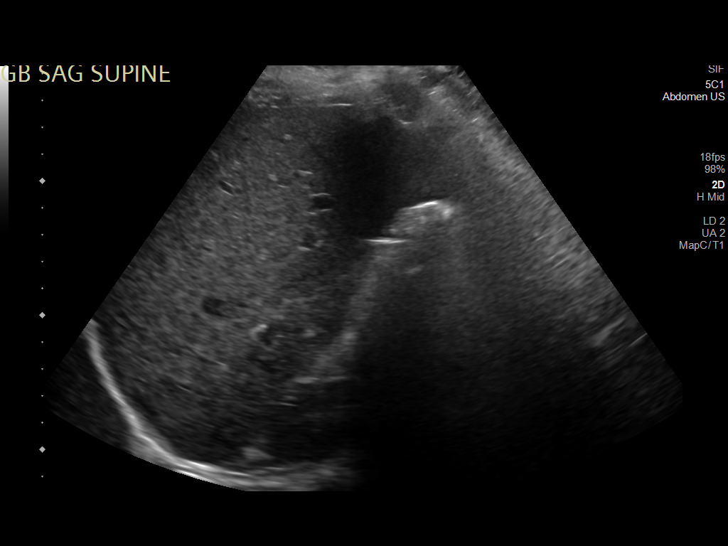
[im 10/39]
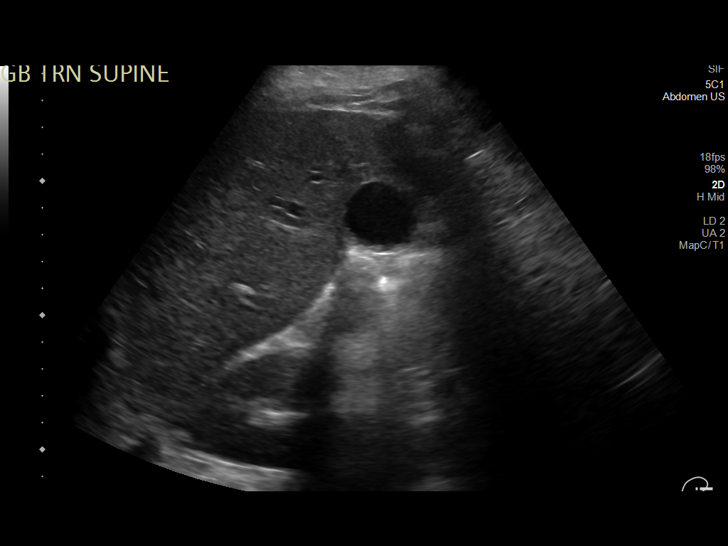
[im 13/39]
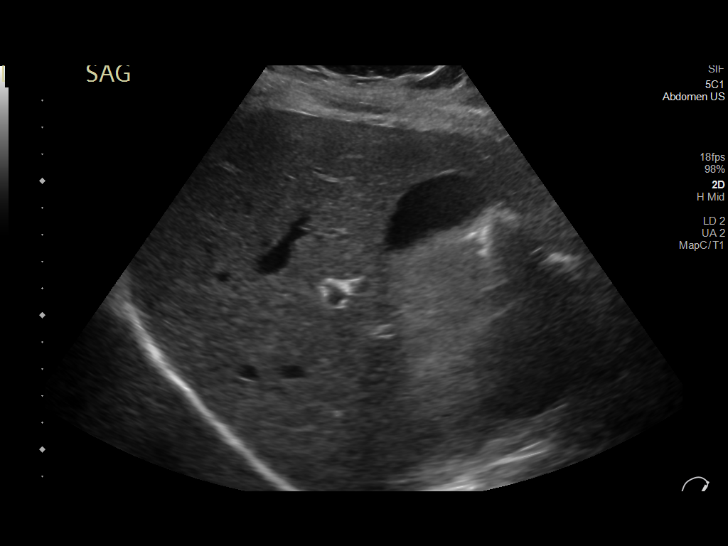
[im 16/39]
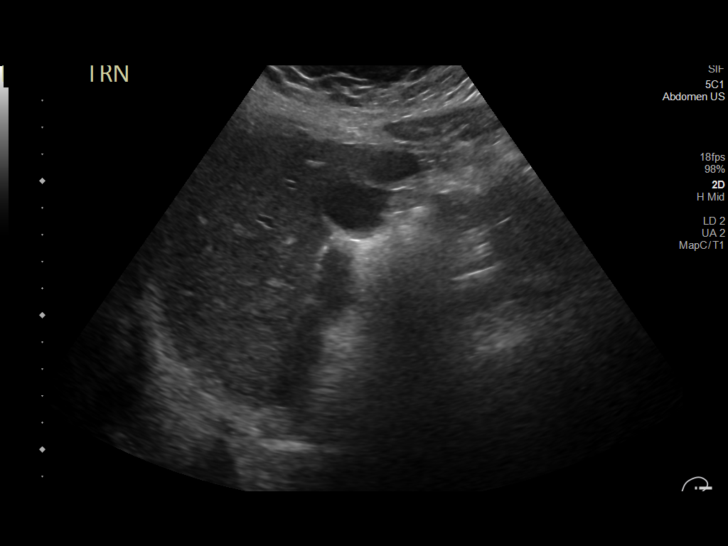
[im 20/39]
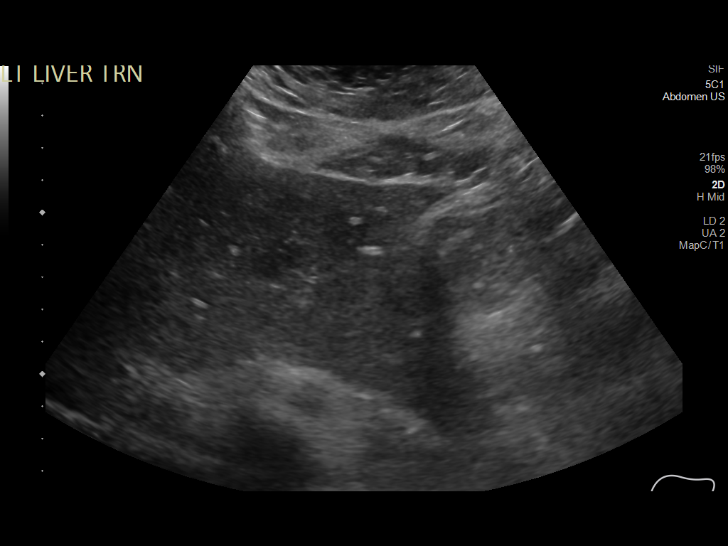
[im 23/39]
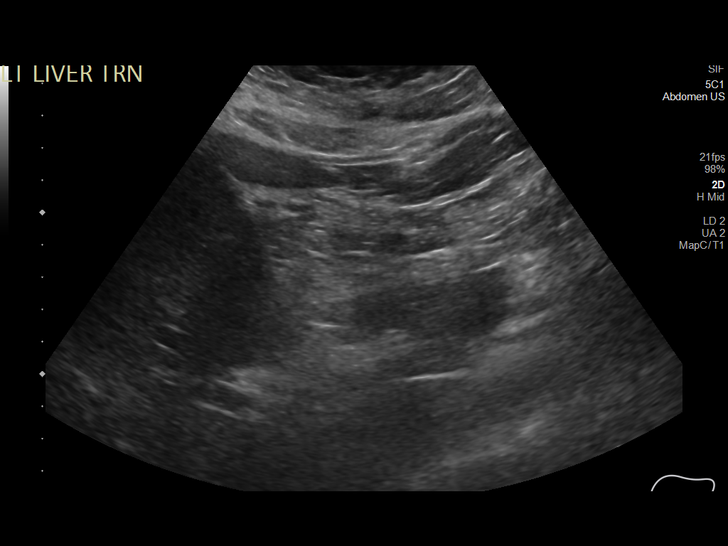
[im 26/39]
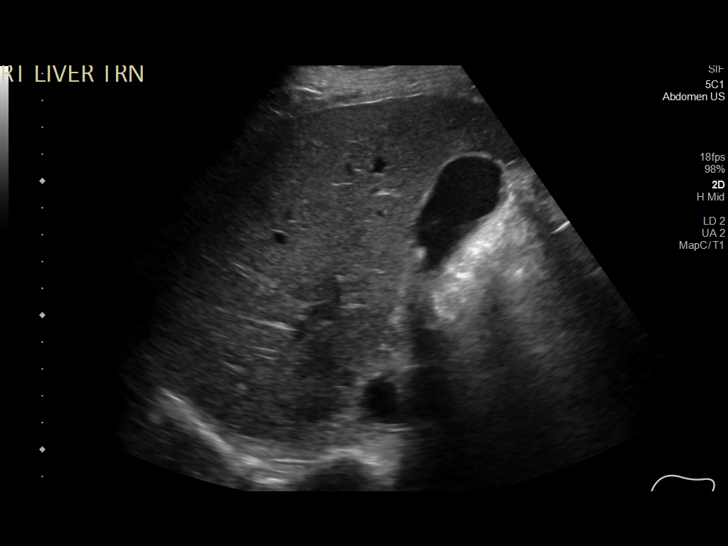
[im 29/39]
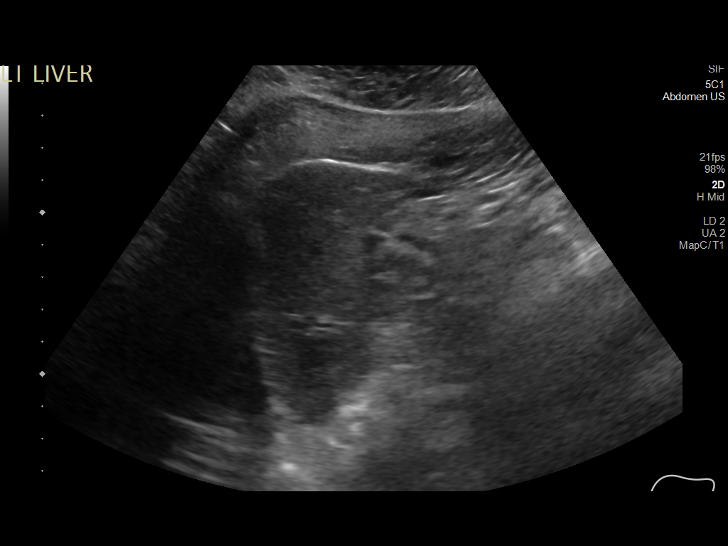
[im 32/39]
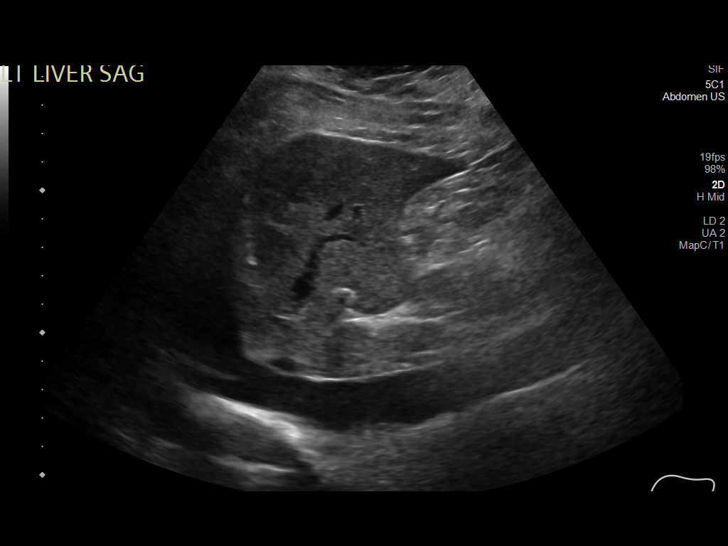
[im 35/39]
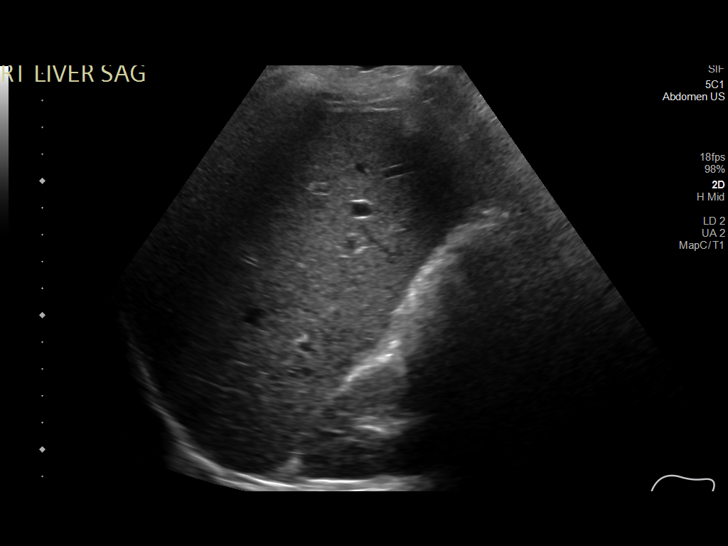
[im 39/39]
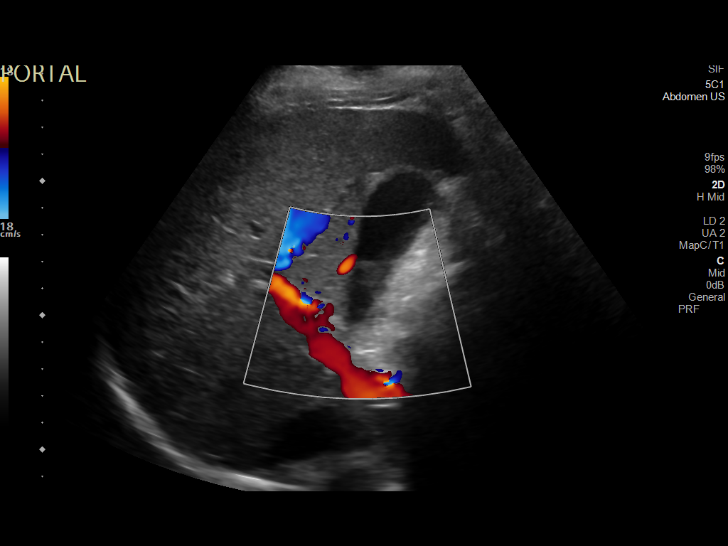

[13 of 25 positions shown; findings below may reference images not displayed]

FINDINGS: Gallbladder:

Cholelithiasis. Gallstones individually estimated at 15 mm (image
5). Gallbladder wall thickness remains normal at 2 to 3 mm. No
pericholecystic fluid. No sonographic Murphy sign elicited.

Common bile duct:

Diameter: 4 mm, normal.

Liver:

Normal background liver echogenicity. Mixed echogenic but mostly
isoechoic area measuring 3 cm redemonstrated in the right hepatic
lobe, indeterminate and not typical of focal fat. No other discrete
liver lesion. Portal vein is patent on color Doppler imaging with
normal direction of blood flow towards the liver.

Other: Negative visible right kidney.
IMPRESSION: 1. Cholelithiasis without evidence of acute cholecystitis or bile
duct obstruction.
2. Redemonstrated 3 cm left hepatic lobe indeterminate lesion of
mixed echogenicity, partially isoechoic. Favor benign etiology but
recommend routine follow-up Abdomen MRI (liver protocol without and
with contrast) to fully characterize. This recommendation adapted
from ACR consensus guidelines: Management of Incidental Liver
Lesions on CT: A White Paper of the ACR Incidental Findings
Committee. [HOSPITAL] [id number]; [id number].

## 2023-02-12 ENCOUNTER — Emergency Department (HOSPITAL_BASED_OUTPATIENT_CLINIC_OR_DEPARTMENT_OTHER)
Admission: EM | Admit: 2023-02-12 | Discharge: 2023-02-12 | Disposition: A | Discharge status: Home / Self Care | Payer: Medicaid Other | Attending: Emergency Medicine | Admitting: Emergency Medicine

## 2023-02-12 DIAGNOSIS — Z20822 Contact with and (suspected) exposure to covid-19: Secondary | ICD-10-CM | POA: Diagnosis not present

## 2023-02-12 DIAGNOSIS — J45909 Unspecified asthma, uncomplicated: Secondary | ICD-10-CM | POA: Diagnosis not present

## 2023-02-12 DIAGNOSIS — R509 Fever, unspecified: Secondary | ICD-10-CM

## 2023-02-12 DIAGNOSIS — J039 Acute tonsillitis, unspecified: Secondary | ICD-10-CM | POA: Insufficient documentation

## 2023-02-12 DIAGNOSIS — J029 Acute pharyngitis, unspecified: Secondary | ICD-10-CM | POA: Diagnosis present

## 2023-02-12 LAB — GROUP A STREP BY PCR: Group A Strep by PCR: NOT DETECTED

## 2023-02-12 LAB — SARS CORONAVIRUS 2 BY RT PCR: SARS Coronavirus 2 by RT PCR: NEGATIVE

## 2023-02-12 MED ORDER — DEXAMETHASONE 4 MG PO TABS
6.0000 mg | ORAL_TABLET | Freq: Once | ORAL | Status: AC
  Administered 2023-02-12: 6 mg via ORAL
  Filled 2023-02-12: qty 2

## 2023-02-12 MED ORDER — ACETAMINOPHEN 325 MG PO TABS
650.0000 mg | ORAL_TABLET | Freq: Once | ORAL | Status: AC | PRN
  Administered 2023-02-12: 650 mg via ORAL
  Filled 2023-02-12: qty 2

## 2023-02-12 NOTE — ED Provider Notes (Signed)
[place] EMERGENCY DEPARTMENT AT [facility] Provider Note   CSN: [id number] Arrival date & time: 02/12/23  4782     History  Chief Complaint  Patient presents with   Sore Throat    Kaylee Alvarez is a 22 y.o. female with past medical history significant for asthma presents to the ED complaining of sore throat and fever that began 2 days ago.  Patient states that she feels like her tonsils are enlarged and she wanted to be checked out before they got too bad.  Patient reports subjective fever with chills.  She has been able to eat and drink normally.  Denies cough, congestion, rhinorrhea, otalgia, nausea, vomiting.       Home Medications Prior to Admission medications   Medication Sig Start Date End Date Taking? Authorizing Provider  cetirizine (ZYRTEC) 10 MG tablet Take 10 mg by mouth at bedtime as needed for allergies. 06/16/20   [provider]  MILI 0.25-35 MG-MCG tablet Take 1 tablet by mouth daily. 06/16/20   [provider]  ondansetron (ZOFRAN ODT) 4 MG disintegrating tablet Take 1 tablet (4 mg total) by mouth every 8 (eight) hours as needed for nausea or vomiting. 07/01/20   Petrucelli, Samantha R, PA-C      Allergies    Amoxicillin and Ibuprofen    Review of Systems   Review of Systems  Constitutional:  Positive for chills and fever.  HENT:  Positive for sore throat. Negative for congestion, rhinorrhea and trouble swallowing.   Respiratory:  Negative for cough.   Gastrointestinal:  Negative for nausea and vomiting.    Physical Exam Updated Vital Signs BP 107/85   Pulse 99   Temp 98.7 F (37.1 C) (Oral)   Resp 20   Ht 5\' 2"  (1.575 m)   Wt 76.7 kg   LMP 01/25/2023 (Approximate)   SpO2 98%   BMI 30.91 kg/m  Physical Exam Vitals and nursing note reviewed.  Constitutional:      General: She is not in acute distress.    Appearance: Normal appearance. She is not ill-appearing or diaphoretic.  HENT:     Right Ear:  Tympanic membrane and ear canal normal.     Left Ear: Tympanic membrane and ear canal normal.     Nose: Nose normal. No congestion or rhinorrhea.     Mouth/Throat:     Lips: Pink.     Mouth: Mucous membranes are moist.     Pharynx: Posterior oropharyngeal erythema present. No pharyngeal swelling, oropharyngeal exudate or uvula swelling.     Tonsils: Tonsillar exudate present. No tonsillar abscesses. 2+ on the right. 2+ on the left.     Comments: Tonsils are erythematous with few scattered white spots. Cardiovascular:     Rate and Rhythm: Normal rate and regular rhythm.  Pulmonary:     Effort: Pulmonary effort is normal.  Lymphadenopathy:     Cervical: No cervical adenopathy.  Skin:    General: Skin is warm and dry.     Capillary Refill: Capillary refill takes less than 2 seconds.  Neurological:     Mental Status: She is alert. Mental status is at baseline.  Psychiatric:        Mood and Affect: Mood normal.        Behavior: Behavior normal.     ED Results / Procedures / Treatments   Labs (all labs ordered are listed, but only abnormal results are displayed) Labs Reviewed  GROUP A STREP BY PCR  SARS CORONAVIRUS  2 BY RT PCR    EKG None  Radiology No results found.  Procedures Procedures    Medications Ordered in ED Medications  acetaminophen (TYLENOL) tablet 650 mg (650 mg Oral Given 02/12/23 0938)  dexamethasone (DECADRON) tablet 6 mg (6 mg Oral Given 02/12/23 1055)    ED Course/ Medical Decision Making/ A&P                                 Medical Decision Making Risk OTC drugs. Prescription drug management.   This patient presents to the ED with chief complaint(s) of sore throat, fever.  The complaint involves an extensive differential diagnosis and also carries with it a high risk of complications and morbidity.    The differential diagnosis includes strep pharyngitis, tonsillitis, viral syndrome   Initial Assessment:   Exam significant for overall  well-appearing patient who is not in acute distress.  Heart rate is normal in the 90s with regular rhythm.  Posterior oropharynx with erythema.  Tonsils are 2+ bilaterally with erythema and scattered white patches.  Patient is able to swallow without difficulty.  No muffling of voice.  No cervical lymphadenopathy appreciated.  Lungs are clear to auscultation bilaterally.  Skin is warm and dry.  Bilateral EACs and TMs are unremarkable.  Treatment and Reassessment: Patient was febrile upon arrival to ED at 101F.  She was given 650 mg of Tylenol.  Patient also given 6 mg of Decadron for tonsillitis.  Patient tested negative for strep pharyngitis and COVID.  Disposition:   Discussed supportive care measures and continued over-the-counter treatments for sore throat and fever at home.  Recommended PCP follow-up if symptoms persist.  School note provided.  The patient has been appropriately medically screened and/or stabilized in the ED. I have low suspicion for any other emergent medical condition which would require further screening, evaluation or treatment in the ED or require inpatient management. At time of discharge the patient is hemodynamically stable and in no acute distress. I have discussed work-up results and diagnosis with patient and answered all questions. Patient is agreeable with discharge plan. We discussed strict return precautions for returning to the emergency department and they verbalized understanding.            Final Clinical Impression(s) / ED Diagnoses Final diagnoses:  Tonsillitis with exudate  Fever, unspecified fever cause    Rx / DC Orders ED Discharge Orders     None         Lenard Simmer, PA-C 02/12/23 1100    Alvira Monday, MD 02/12/23 2349

## 2023-02-12 NOTE — ED Notes (Signed)
Pt discharged in stable condition. Pt expressed understanding about discharge instructions and Rx and to follow up with pcp and to return to ER for any further concerns or complications. Pt ambulated out with even steady gait, no apparent distress.

## 2023-02-12 NOTE — Discharge Instructions (Signed)
Thank you for allowing [place] to be a part of your care today.  You tested negative for COVID and strep throat.  Your symptoms are likely related to a another virus that we will need to run its course.  You were given an oral steroid to help with the swelling and discomfort in your throat.  You may continue taking Tylenol and/or naproxen as needed for fever, headache, body aches, etc.  You may take [phone] mg of Tylenol every 6 hours and 440 mg of naproxen up to twice daily.    Follow up with primary care if symptoms do not improve.  Return to the ED if you develop sudden worsening of your symptoms or if you have new concerns.

## 2023-02-12 NOTE — ED Triage Notes (Signed)
Sore throat with fever..  No cough  Onset 2 days ago.  States feels throat  swelling.  Appears NAD.
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
## 2023-07-15 ENCOUNTER — Other Ambulatory Visit: Payer: Self-pay

## 2023-07-15 ENCOUNTER — Encounter (HOSPITAL_BASED_OUTPATIENT_CLINIC_OR_DEPARTMENT_OTHER): Payer: Self-pay

## 2023-07-15 ENCOUNTER — Emergency Department (HOSPITAL_BASED_OUTPATIENT_CLINIC_OR_DEPARTMENT_OTHER)
Admission: EM | Admit: 2023-07-15 | Discharge: 2023-07-15 | Disposition: A | Discharge status: Home / Self Care | Attending: Emergency Medicine | Admitting: Emergency Medicine

## 2023-07-15 DIAGNOSIS — R300 Dysuria: Secondary | ICD-10-CM | POA: Diagnosis not present

## 2023-07-15 DIAGNOSIS — R350 Frequency of micturition: Secondary | ICD-10-CM | POA: Diagnosis present

## 2023-07-15 LAB — URINALYSIS, W/ REFLEX TO CULTURE (INFECTION SUSPECTED)
Bacteria, UA: NONE SEEN
Bilirubin Urine: NEGATIVE
Glucose, UA: NEGATIVE mg/dL
Hgb urine dipstick: NEGATIVE
Ketones, ur: NEGATIVE mg/dL
Nitrite: NEGATIVE
Protein, ur: NEGATIVE mg/dL
Specific Gravity, Urine: 1.011 (ref 1.005–1.030)
pH: 5.5 (ref 5.0–8.0)

## 2023-07-15 LAB — PREGNANCY, URINE: Preg Test, Ur: NEGATIVE

## 2023-07-15 MED ORDER — PHENAZOPYRIDINE HCL 200 MG PO TABS: 200.0000 mg | ORAL_TABLET | Freq: Three times a day (TID) | ORAL | 0 refills | Status: DC

## 2023-07-15 MED ORDER — PHENAZOPYRIDINE HCL 200 MG PO TABS: 200.0000 mg | ORAL_TABLET | Freq: Three times a day (TID) | ORAL | 0 refills | Status: AC

## 2023-07-15 NOTE — ED Provider Notes (Signed)
 [place] EMERGENCY DEPARTMENT AT [facility] Provider Note   CSN: [id number] Arrival date & time: 07/15/23  1718     History  Chief Complaint  Patient presents with   Urinary Frequency    Kaylee Alvarez is a 23 y.o. female.  23 year old female here today for burning with micturition, occasional increased urge.  Symptoms get better when she drinks a lot of water.  She has not had any fever or chills.  No back pain.   Urinary Frequency       Home Medications Prior to Admission medications   Medication Sig Start Date End Date Taking? Authorizing Provider  phenazopyridine (PYRIDIUM) 200 MG tablet Take 1 tablet (200 mg total) by mouth 3 (three) times daily. 07/15/23  Yes Anders Simmonds T, DO  cetirizine (ZYRTEC) 10 MG tablet Take 10 mg by mouth at bedtime as needed for allergies. 06/16/20   [provider]  MILI 0.25-35 MG-MCG tablet Take 1 tablet by mouth daily. 06/16/20   [provider]  ondansetron (ZOFRAN ODT) 4 MG disintegrating tablet Take 1 tablet (4 mg total) by mouth every 8 (eight) hours as needed for nausea or vomiting. 07/01/20   Petrucelli, Samantha R, PA-C      Allergies    Amoxicillin and Ibuprofen    Review of Systems   Review of Systems  Genitourinary:  Positive for frequency.    Physical Exam Updated Vital Signs BP 108/67 (BP Location: Right Arm)   Pulse 71   Temp 97.8 F (36.6 C)   Resp 18   SpO2 100%  Physical Exam Vitals reviewed.  Constitutional:      Appearance: She is not toxic-appearing.  Pulmonary:     Effort: Pulmonary effort is normal.  Abdominal:     General: Abdomen is flat. There is no distension.     Palpations: Abdomen is soft.     Tenderness: There is no abdominal tenderness. There is no right CVA tenderness, left CVA tenderness or guarding.  Neurological:     Mental Status: She is alert.     ED Results / Procedures / Treatments   Labs (all labs ordered are listed, but only abnormal  results are displayed) Labs Reviewed  URINALYSIS, W/ REFLEX TO CULTURE (INFECTION SUSPECTED) - Abnormal; Notable for the following components:      Result Value   Color, Urine COLORLESS (*)    Leukocytes,Ua SMALL (*)    All other components within normal limits  URINE CULTURE  PREGNANCY, URINE    EKG None  Radiology No results found.  Procedures Procedures    Medications Ordered in ED Medications - No data to display  ED Course/ Medical Decision Making/ A&P                                 Medical Decision Making 23 year old female here today with burning with urination.  Plan-patient symptoms could be consistent with a UTI, possibly interstitial cystitis.  Her urine does not show an obvious infection, and overall she looks well.  Believe it is prudent to wait for urine culture.  Will send patient with Pyridium.  Discussed with patient typical process of evaluating a urine culture.  She is going to follow-up with her PCP.  Amount and/or Complexity of Data Reviewed Labs: ordered.           Final Clinical Impression(s) / ED Diagnoses Final diagnoses:  Dysuria    Rx /  DC Orders ED Discharge Orders          Ordered    phenazopyridine (PYRIDIUM) 200 MG tablet  3 times daily        07/15/23 2100              Anders Simmonds T, DO 07/15/23 2101

## 2023-07-15 NOTE — Discharge Instructions (Addendum)
 You can take Pyridium 3 times per day.  This can help out with the discomfort you are having with urination.  You will be called within 48 hours if your urine culture is positive.  Follow-up with your primary care doctor next week.

## 2023-07-15 NOTE — ED Triage Notes (Signed)
 Pt c/o "symptoms of UTI x2 days- burning when urination, frequent urination & small amounts." Denies fevers, back pain.

## 2023-07-17 LAB — URINE CULTURE: Culture: 30000 — AB

## 2023-07-18 ENCOUNTER — Telehealth (HOSPITAL_BASED_OUTPATIENT_CLINIC_OR_DEPARTMENT_OTHER): Payer: Self-pay | Admitting: *Deleted

## 2023-07-18 NOTE — Telephone Encounter (Signed)
 Post ED Visit - Positive Culture Follow-up: Successful Patient Follow-Up  Culture assessed and recommendations reviewed by:  [x]  Lennie Muckle, Pharm.D. []  Celedonio Miyamoto, Pharm.D., BCPS AQ-ID []  Garvin Fila, Pharm.D., BCPS []  Georgina Pillion, Pharm.D., BCPS []  [place], [address].D., BCPS, AAHIVP []  Estella Husk, Pharm.D., BCPS, AAHIVP []  Lysle Pearl, PharmD, BCPS []  Phillips Climes, PharmD, BCPS []  Agapito Games, PharmD, BCPS []  Verlan Friends, PharmD  Positive urine culture  [x]  Patient discharged without antimicrobial prescription and treatment is now indicated []  Organism is resistant to prescribed ED discharge antimicrobial []  Patient with positive blood cultures  Symptom check- Pt stated she is having some burning when she urinates.  Changes discussed with ED provider: [name], PA-C New antibiotic prescription Cefadroxil 500mg  PO twice daily x 5 days Called to [place], [place], [place]  Contacted patient, date 07/18/23, time 1025   Kaylee Alvarez 07/18/2023, 10:23 AM

## 2023-07-18 NOTE — Progress Notes (Signed)
 ED Antimicrobial Stewardship Positive Culture Follow Up   Kaylee Alvarez is an 23 y.o. female who presented to [facility] on 07/15/2023 with a chief complaint of  Chief Complaint  Patient presents with   Urinary Frequency    Recent Results (from the past 720 hours)  Urine Culture     Status: Abnormal   Collection Time: 07/15/23  5:29 PM   Specimen: Urine, Random  Result Value Ref Range Status   Specimen Description   Final    URINE, RANDOM Performed at [facility], [address], [place], [place] [address]    Special Requests   Final    NONE Reflexed from [id number] Performed at [facility], [address], [place], [place] [address]    Culture 30,000 COLONIES/mL ESCHERICHIA COLI (A)  Final   Report Status 07/17/2023 FINAL  Final   Organism ID, Bacteria ESCHERICHIA COLI (A)  Final      Susceptibility   Escherichia coli - MIC*    AMPICILLIN >=32 RESISTANT Resistant     CEFAZOLIN 16 SENSITIVE Sensitive     CEFEPIME <=0.12 SENSITIVE Sensitive     CEFTRIAXONE <=0.25 SENSITIVE Sensitive     CIPROFLOXACIN <=0.25 SENSITIVE Sensitive     GENTAMICIN <=1 SENSITIVE Sensitive     IMIPENEM <=0.25 SENSITIVE Sensitive     NITROFURANTOIN <=16 SENSITIVE Sensitive     TRIMETH/SULFA <=20 SENSITIVE Sensitive     AMPICILLIN/SULBACTAM >=32 RESISTANT Resistant     PIP/TAZO 64 INTERMEDIATE Intermediate ug/mL    * 30,000 COLONIES/mL ESCHERICHIA COLI    Plan: Call patient for a symptom check     > If patient has no symptoms, do not treat     > If patient has persistent symptoms, START cefadroxil 500 mg PO twice daily for 5 days  ED Provider: Jannifer Hick, PA-C   Lennie Muckle, PharmD PGY1 Pharmacy Resident 07/18/2023 9:24 AM Monday - Friday phone -  [phone] Saturday - Sunday phone - [phone]

## 2023-10-03 LAB — HM PAP SMEAR

## 2023-11-01 ENCOUNTER — Other Ambulatory Visit: Payer: Self-pay

## 2023-11-02 ENCOUNTER — Other Ambulatory Visit (HOSPITAL_COMMUNITY)
Admission: RE | Admit: 2023-11-02 | Discharge: 2023-11-02 | Disposition: A | Discharge status: Home / Self Care | Source: Ambulatory Visit | Attending: Obstetrics and Gynecology | Admitting: Obstetrics and Gynecology

## 2023-11-02 ENCOUNTER — Ambulatory Visit: Admitting: Obstetrics and Gynecology

## 2023-11-02 ENCOUNTER — Encounter: Payer: Self-pay | Admitting: Obstetrics and Gynecology

## 2023-11-02 VITALS — BP 110/76 | HR 75 | Ht 62.0 in | Wt 180.0 lb

## 2023-11-02 DIAGNOSIS — N760 Acute vaginitis: Secondary | ICD-10-CM

## 2023-11-02 DIAGNOSIS — Z3202 Encounter for pregnancy test, result negative: Secondary | ICD-10-CM

## 2023-11-02 DIAGNOSIS — Z1331 Encounter for screening for depression: Secondary | ICD-10-CM | POA: Diagnosis not present

## 2023-11-02 DIAGNOSIS — R87810 Cervical high risk human papillomavirus (HPV) DNA test positive: Secondary | ICD-10-CM

## 2023-11-02 DIAGNOSIS — R87612 Low grade squamous intraepithelial lesion on cytologic smear of cervix (LGSIL): Secondary | ICD-10-CM | POA: Diagnosis present

## 2023-11-02 NOTE — Progress Notes (Signed)
 NEW GYNECOLOGY PATIENT Patient name: Kaylee Alvarez MRN [id number]  Date of birth: [birth year] Chief Complaint:   Colposcopy     History:  Kaylee Alvarez is a 23 y.o. G0P0000 being seen today for colpo/abnormal pap smear.   Compalint of intermitteng vaignal discharge and odor. Had ahda UTI,  treated with antibioitc and then white discharge was told when getting gpap would be tested fo rinfection. Did not have any treatment for yeast infection. Noticed after intercourse that there was no odor. Having some dischage today      Gynecologic History Patient's last menstrual period was 09/25/2023 (exact date). Last Pap: 10/03/2023 LSIL, HPV positive Last Mammogram: n/a Last Colonoscopy: n/a  Obstetric History OB History  Gravida Para Term Preterm AB Living  0 0 0 0 0 0  SAB IAB Ectopic Multiple Live Births  0 0 0 0 0    Past Medical History:  Diagnosis Date   Asthma    Pneumonia    Vaginal Pap smear, abnormal     Past Surgical History:  Procedure Laterality Date   CHOLECYSTECTOMY      Current Outpatient Medications on File Prior to Visit  Medication Sig Dispense Refill   cetirizine (ZYRTEC) 10 MG tablet Take 10 mg by mouth at bedtime as needed for allergies.     MILI 0.25-35 MG-MCG tablet Take 1 tablet by mouth daily. (Patient not taking: Reported on 11/02/2023)     ondansetron  (ZOFRAN  ODT) 4 MG disintegrating tablet Take 1 tablet (4 mg total) by mouth every 8 (eight) hours as needed for nausea or vomiting. (Patient not taking: Reported on 11/02/2023) 8 tablet 0   phenazopyridine  (PYRIDIUM ) 200 MG tablet Take 1 tablet (200 mg total) by mouth 3 (three) times daily. (Patient not taking: Reported on 11/02/2023) 6 tablet 0   No current facility-administered medications on file prior to visit.    Allergies  Allergen Reactions   Amoxicillin  Rash    Other Reaction(s) from [facility]: rash   Ibuprofen  Rash and Hives    Social History:  reports that she has never  smoked. She has never used smokeless tobacco. She reports current alcohol use. She reports that she does not use drugs.  Family History  Problem Relation Age of Onset   Diabetes Maternal Grandmother    Diabetes Paternal Grandmother     The following portions of the patient's history were reviewed and updated as appropriate: allergies, current medications, past family history, past medical history, past social history, past surgical history and problem list.  Review of Systems Pertinent items noted in HPI and remainder of comprehensive ROS otherwise negative.  Physical Exam:  BP 110/76   Pulse 75   Ht 5' 2 (1.575 m)   Wt 180 lb (81.6 kg)   LMP 09/25/2023 (Exact Date)   BMI 32.92 kg/m  Physical Exam Vitals and nursing note reviewed. Exam conducted with a chaperone present.  Constitutional:      Appearance: Normal appearance.  Pulmonary:     Effort: Pulmonary effort is normal.  Abdominal:     Palpations: Abdomen is soft.  Genitourinary:    General: Normal vulva.     Exam position: Lithotomy position.  Neurological:     Mental Status: She is alert.       GYNECOLOGY OFFICE COLPOSCOPY PROCEDURE NOTE  23 y.o. 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 Discussed role for HPV in cervical dysplasia, need for surveillance.  Patient gave informed  written consent, time out was performed.  Placed in lithotomy position. Cervix viewed with speculum and colposcope after application of acetic acid.   Colposcopy adequate? Yes  no visible lesions, no mosaicism, no punctation, and no abnormal vasculature  All specimens were labeled and sent to pathology.  Chaperone was present during entire procedure.       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 Recommend repeat pap smear in 1 year.  Given diflucan for presumed candida infection  - POCT urine pregnancy -  Cervicovaginal ancillary only( [place])    Routine preventative health maintenance measures emphasized. Please refer to After Visit Summary for other counseling recommendations.   Follow-up: Return for repeat pap in 1 year.      Carter Quarry, MD Obstetrician & Gynecologist, Faculty Practice [facility] for [employer], [facility]

## 2023-11-02 NOTE — Progress Notes (Addendum)
 23 y.o. New GYN presents for COLPO. LSIL on PAP.  UPT  Negative

## 2023-11-03 LAB — POCT URINE PREGNANCY: Preg Test, Ur: NEGATIVE

## 2023-11-04 ENCOUNTER — Ambulatory Visit: Payer: Self-pay | Admitting: Obstetrics and Gynecology

## 2023-11-04 LAB — CERVICOVAGINAL ANCILLARY ONLY
Bacterial Vaginitis (gardnerella): NEGATIVE
Candida Glabrata: NEGATIVE
Candida Vaginitis: NEGATIVE
Chlamydia: NEGATIVE
Comment: NEGATIVE
Comment: NEGATIVE
Comment: NEGATIVE
Comment: NEGATIVE
Comment: NEGATIVE
Comment: NORMAL
Neisseria Gonorrhea: NEGATIVE
Trichomonas: NEGATIVE
